# Patient Record
Sex: Male | Born: 1945 | Race: White | Hispanic: No | Marital: Married
Health system: Southern US, Community
[De-identification: ages and names within clinical notes are randomized; demographics above are authoritative.]

---

## 2020-02-10 ENCOUNTER — Ambulatory Visit: Payer: Medicare Other | Attending: Internal Medicine

## 2020-02-10 DIAGNOSIS — Z23 Encounter for immunization: Secondary | ICD-10-CM | POA: Insufficient documentation

## 2020-02-10 NOTE — Progress Notes (Signed)
   Covid-[facility] Vaccination [facility]  Name:  Craig Leach    MRN: [id number] DOB: [birth year]  02/10/2020  [name] was observed post Covid-19 immunization for 15 minutes without incidence. He was provided with Vaccine Information Sheet and instruction to access the V-Safe system.   [name] was instructed to call 911 with any severe reactions post vaccine: Marland Kitchen Difficulty breathing  . Swelling of your face and throat  . A fast heartbeat  . A bad rash all over your body  . Dizziness and weakness    Immunizations Administered    Name Date Dose VIS Date Route   Pfizer COVID-19 Vaccine 02/10/2020 12:57 PM 0.3 mL 11/30/2019 Intramuscular   Manufacturer: [place]   Lot: [id number]   [place]: [id number]

## 2020-02-14 ENCOUNTER — Other Ambulatory Visit: Payer: Self-pay | Admitting: Urology

## 2020-02-14 DIAGNOSIS — R972 Elevated prostate specific antigen [PSA]: Secondary | ICD-10-CM

## 2020-03-05 ENCOUNTER — Ambulatory Visit: Payer: Medicare Other | Attending: Internal Medicine

## 2020-03-05 DIAGNOSIS — Z23 Encounter for immunization: Secondary | ICD-10-CM

## 2020-03-05 NOTE — Progress Notes (Signed)
   Covid-19 Vaccination Clinic  Name:  Craig Leach    MRN: [id number] DOB: 04-02-1946  03/05/2020  [name] was observed post Covid-19 immunization for 15 minutes without incident. He was provided with Vaccine Information Sheet and instruction to access the V-Safe system.   [name] was instructed to call 911 with any severe reactions post vaccine: Marland Kitchen Difficulty breathing  . Swelling of face and throat  . A fast heartbeat  . A bad rash all over body  . Dizziness and weakness   Immunizations Administered    Name Date Dose VIS Date Route   Pfizer COVID-19 Vaccine 03/05/2020 12:02 PM 0.3 mL 11/30/2019 Intramuscular   Manufacturer: [place]   Lot: [id number]   [place]: [id number]

## 2020-03-11 ENCOUNTER — Ambulatory Visit
Admission: RE | Admit: 2020-03-11 | Discharge: 2020-03-11 | Disposition: A | Discharge status: Home / Self Care | Payer: Medicare Other | Source: Ambulatory Visit | Attending: Urology | Admitting: Urology

## 2020-03-11 ENCOUNTER — Other Ambulatory Visit: Payer: Self-pay

## 2020-03-11 DIAGNOSIS — R972 Elevated prostate specific antigen [PSA]: Secondary | ICD-10-CM

## 2020-03-11 IMAGING — MR MR PROSTATE WO/W CM
56 series · 56 of 56 positions shown · IV contrast (19ml Multihance)
Comparison: None.

CLINICAL DATA: Elevated PSA equal 7.35.  Benign biopsy [DATE].

EXAM:
MR PROSTATE WITHOUT AND WITH CONTRAST
TECHNIQUE: Multiplanar multisequence MRI images were obtained of the pelvis
centered about the prostate. Pre and post contrast images were
obtained.
CONTRAST:  19mL MULTIHANCE GADOBENATE DIMEGLUMINE 529 MG/ML IV SOLN

[Series 3: bSSFP fat-sat · axial · 8.0mm · 0.78mm/px · 1 of 28 slices shown]
[im 1/28]
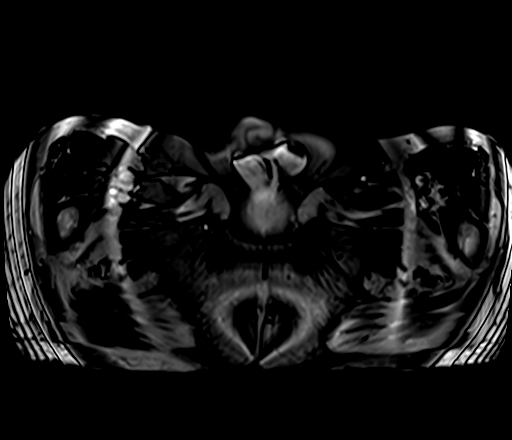

[Series 4: T1 · axial · 5.0mm · 1.25mm/px · 1 of 80 slices shown]
[im 1/80]
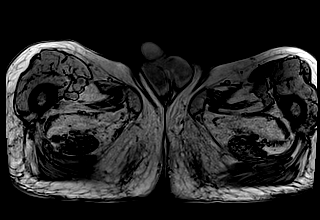

[Series 5: T2 · coronal · 3.5mm · 0.70mm/px · 1 of 29 slices shown (1 of 3)]
[im 1/29]
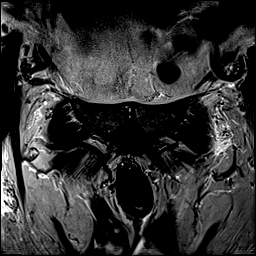

[Series 6: DWI · axial · 3.5mm · 1.75mm/px · 1 of 81 slices shown (1 of 3)]
[im 1/81]
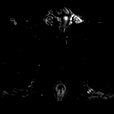

[Series 7: DWI · axial · 3.5mm · 1.75mm/px · 1 of 27 slices shown (2 of 3)]
[im 1/27]
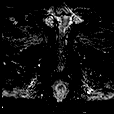

[Series 8: DWI · axial · 3.5mm · 1.56mm/px · 1 of 25 slices shown (3 of 3)]
[im 1/25]
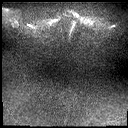

[Series 9: T2 · axial · 3.5mm · 0.70mm/px · 1 of 23 slices shown (2 of 3)]
[im 1/23]
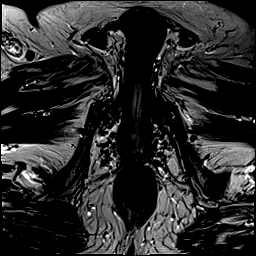

[Series 10: T2 · axial · 1.0mm · 1.04mm/px · 1 of 80 slices shown (3 of 3)]
[im 1/80]
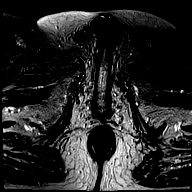

[Series 11: pre t1_twist_tra_dyn_ttc=5.8s · axial · non-contrast · 3.5mm · 0.83mm/px · 1 of 22 slices shown]
[im 1/22]
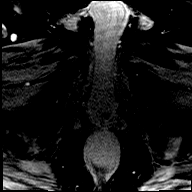

[Series 12: post t1_twist_tra_dyn-copy center · axial · 3.5mm · 0.83mm/px · 1 of 22 slices shown (1 of 24)]
[im 1/22]
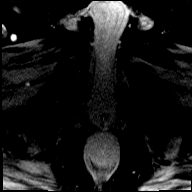

[Series 13: post t1_twist_tra_dyn-copy center · axial · 3.5mm · 0.83mm/px · 1 of 22 slices shown (2 of 24)]
[im 1/22]
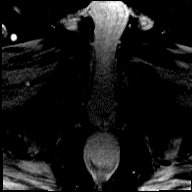

[Series 14: post t1_twist_tra_dyn-copy cent_sub_ttc=(id) · axial · 3.5mm · 0.83mm/px · 1 of 4 slices shown (1 of 23)]
[im 1/4]
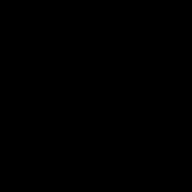

[Series 15: post t1_twist_tra_dyn-copy center · axial · 3.5mm · 0.83mm/px · 1 of 22 slices shown (3 of 24)]
[im 1/22]
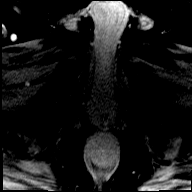

[Series 16: post t1_twist_tra_dyn-copy cent_sub_ttc=(id) · axial · 3.5mm · 0.83mm/px · 1 of 12 slices shown (2 of 23)]
[im 1/12]
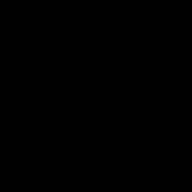

[Series 17: post t1_twist_tra_dyn-copy center · axial · 3.5mm · 0.83mm/px · 1 of 22 slices shown (4 of 24)]
[im 1/22]
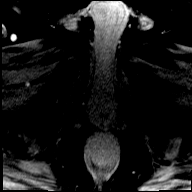

[Series 18: post t1_twist_tra_dyn-copy cent_sub_ttc=(id) · axial · 3.5mm · 0.83mm/px · 1 of 13 slices shown (3 of 23)]
[im 1/13]
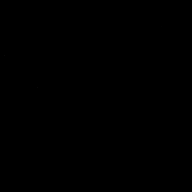

[Series 19: post t1_twist_tra_dyn-copy center · axial · 3.5mm · 0.83mm/px · 1 of 22 slices shown (5 of 24)]
[im 1/22]
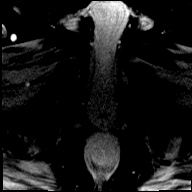

[Series 20: post t1_twist_tra_dyn-copy cent_sub_ttc=(id) · axial · 3.5mm · 0.83mm/px · 1 of 11 slices shown (4 of 23)]
[im 1/11]
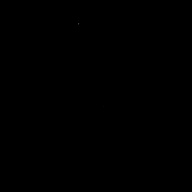

[Series 21: post t1_twist_tra_dyn-copy center · axial · 3.5mm · 0.83mm/px · 1 of 22 slices shown (6 of 24)]
[im 1/22]
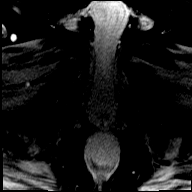

[Series 22: post t1_twist_tra_dyn-copy cent_sub_ttc=(id) · axial · 3.5mm · 0.83mm/px · 1 of 10 slices shown (5 of 23)]
[im 1/10]
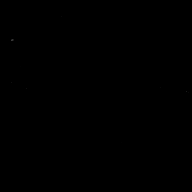

[Series 23: post t1_twist_tra_dyn-copy center · axial · 3.5mm · 0.83mm/px · 1 of 22 slices shown (7 of 24)]
[im 1/22]
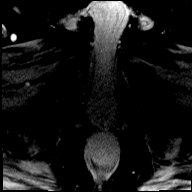

[Series 24: post t1_twist_tra_dyn-copy cent_sub_ttc=(id) · axial · 3.5mm · 0.83mm/px · 1 of 14 slices shown (6 of 23)]
[im 1/14]
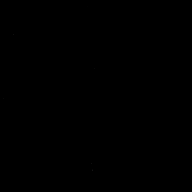

[Series 25: post t1_twist_tra_dyn-copy center · axial · 3.5mm · 0.83mm/px · 1 of 22 slices shown (8 of 24)]
[im 1/22]
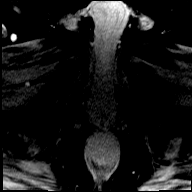

[Series 26: post t1_twist_tra_dyn-copy cent_sub_ttc=(id) · axial · 3.5mm · 0.83mm/px · 1 of 18 slices shown (7 of 23)]
[im 1/18]
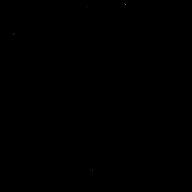

[Series 27: post t1_twist_tra_dyn-copy center · axial · 3.5mm · 0.83mm/px · 1 of 22 slices shown (9 of 24)]
[im 1/22]
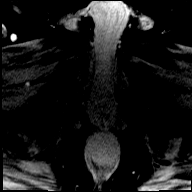

[Series 28: post t1_twist_tra_dyn-copy cent_sub_ttc=(id) · axial · 3.5mm · 0.83mm/px · 1 of 17 slices shown (8 of 23)]
[im 1/17]
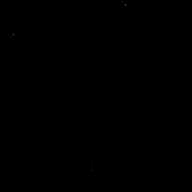

[Series 29: post t1_twist_tra_dyn-copy center · axial · 3.5mm · 0.83mm/px · 1 of 22 slices shown (10 of 24)]
[im 1/22]
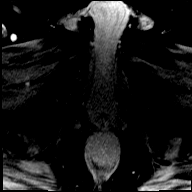

[Series 30: post t1_twist_tra_dyn-copy cent_sub_ttc=(id) · axial · 3.5mm · 0.83mm/px · 1 of 22 slices shown (9 of 23)]
[im 1/22]
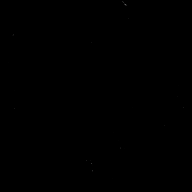

[Series 31: post t1_twist_tra_dyn-copy center · axial · 3.5mm · 0.83mm/px · 1 of 22 slices shown (11 of 24)]
[im 1/22]
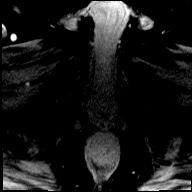

[Series 32: post t1_twist_tra_dyn-copy cent_sub_ttc=(id) · axial · 3.5mm · 0.83mm/px · 1 of 22 slices shown (10 of 23)]
[im 1/22]
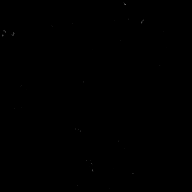

[Series 33: post t1_twist_tra_dyn-copy center · axial · 3.5mm · 0.83mm/px · 1 of 22 slices shown (12 of 24)]
[im 1/22]
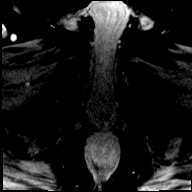

[Series 34: post t1_twist_tra_dyn-copy cent_sub_ttc=(id) · axial · 3.5mm · 0.83mm/px · 1 of 22 slices shown (11 of 23)]
[im 1/22]
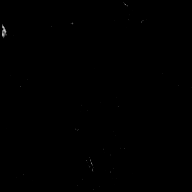

[Series 35: post t1_twist_tra_dyn-copy center · axial · 3.5mm · 0.83mm/px · 1 of 22 slices shown (13 of 24)]
[im 1/22]
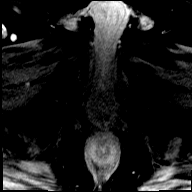

[Series 36: post t1_twist_tra_dyn-copy cent_sub_ttc=(id) · axial · 3.5mm · 0.83mm/px · 1 of 22 slices shown (12 of 23)]
[im 1/22]
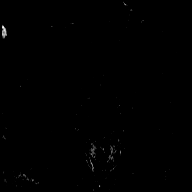

[Series 37: post t1_twist_tra_dyn-copy center · axial · 3.5mm · 0.83mm/px · 1 of 22 slices shown (14 of 24)]
[im 1/22]
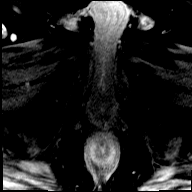

[Series 38: post t1_twist_tra_dyn-copy cent_sub_ttc=(id) · axial · 3.5mm · 0.83mm/px · 1 of 22 slices shown (13 of 23)]
[im 1/22]
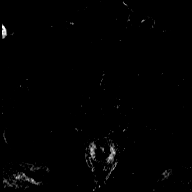

[Series 39: post t1_twist_tra_dyn-copy center · axial · 3.5mm · 0.83mm/px · 1 of 22 slices shown (15 of 24)]
[im 1/22]
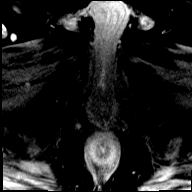

[Series 40: post t1_twist_tra_dyn-copy cent_sub_ttc=(id) · axial · 3.5mm · 0.83mm/px · 1 of 22 slices shown (14 of 23)]
[im 1/22]
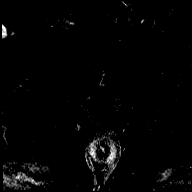

[Series 41: post t1_twist_tra_dyn-copy center · axial · 3.5mm · 0.83mm/px · 1 of 22 slices shown (16 of 24)]
[im 1/22]
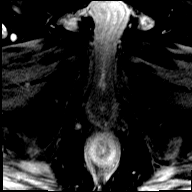

[Series 42: post t1_twist_tra_dyn-copy cent_sub_ttc=(id) · axial · 3.5mm · 0.83mm/px · 1 of 22 slices shown (15 of 23)]
[im 1/22]
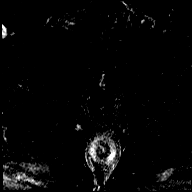

[Series 43: post t1_twist_tra_dyn-copy center · axial · 3.5mm · 0.83mm/px · 1 of 22 slices shown (17 of 24)]
[im 1/22]
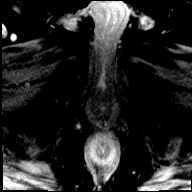

[Series 44: post t1_twist_tra_dyn-copy cent_sub_ttc=(id) · axial · 3.5mm · 0.83mm/px · 1 of 22 slices shown (16 of 23)]
[im 1/22]
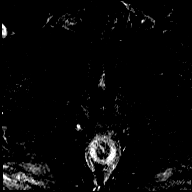

[Series 45: post t1_twist_tra_dyn-copy center · axial · 3.5mm · 0.83mm/px · 1 of 22 slices shown (18 of 24)]
[im 1/22]
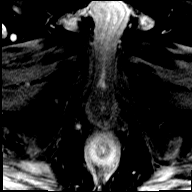

[Series 46: post t1_twist_tra_dyn-copy cent_sub_ttc=(id) · axial · 3.5mm · 0.83mm/px · 1 of 22 slices shown (17 of 23)]
[im 1/22]
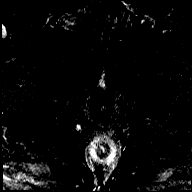

[Series 47: post t1_twist_tra_dyn-copy center · axial · 3.5mm · 0.83mm/px · 1 of 22 slices shown (19 of 24)]
[im 1/22]
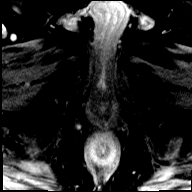

[Series 48: post t1_twist_tra_dyn-copy cent_sub_ttc=(id) · axial · 3.5mm · 0.83mm/px · 1 of 22 slices shown (18 of 23)]
[im 1/22]
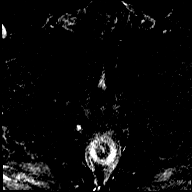

[Series 49: post t1_twist_tra_dyn-copy center · axial · 3.5mm · 0.83mm/px · 1 of 22 slices shown (20 of 24)]
[im 1/22]
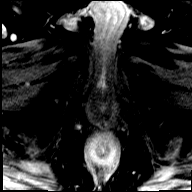

[Series 50: post t1_twist_tra_dyn-copy cent_sub_ttc=(id) · axial · 3.5mm · 0.83mm/px · 1 of 22 slices shown (19 of 23)]
[im 1/22]
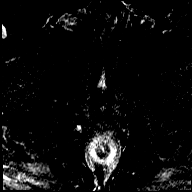

[Series 51: post t1_twist_tra_dyn-copy center · axial · 3.5mm · 0.83mm/px · 1 of 22 slices shown (21 of 24)]
[im 1/22]
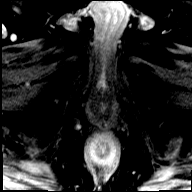

[Series 52: post t1_twist_tra_dyn-copy cent_sub_ttc=(id) · axial · 3.5mm · 0.83mm/px · 1 of 22 slices shown (20 of 23)]
[im 1/22]
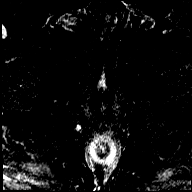

[Series 53: post t1_twist_tra_dyn-copy center · axial · 3.5mm · 0.83mm/px · 1 of 22 slices shown (22 of 24)]
[im 1/22]
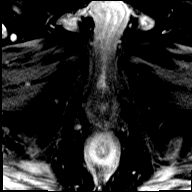

[Series 54: post t1_twist_tra_dyn-copy cent_sub_ttc=(id) · axial · 3.5mm · 0.83mm/px · 1 of 22 slices shown (21 of 23)]
[im 1/22]
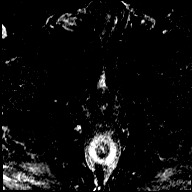

[Series 55: post t1_twist_tra_dyn-copy center · axial · 3.5mm · 0.83mm/px · 1 of 22 slices shown (23 of 24)]
[im 1/22]
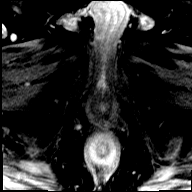

[Series 56: post t1_twist_tra_dyn-copy cent_sub_ttc=(id) · axial · 3.5mm · 0.83mm/px · 1 of 22 slices shown (22 of 23)]
[im 1/22]
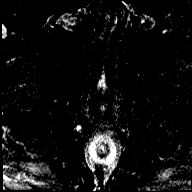

[Series 57: post t1_twist_tra_dyn-copy center · axial · 3.5mm · 0.83mm/px · 1 of 22 slices shown (24 of 24)]
[im 1/22]
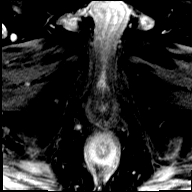

[Series 58: post t1_twist_tra_dyn-copy cent_sub_ttc=(id) · axial · 3.5mm · 0.83mm/px · 1 of 22 slices shown (23 of 23)]
[im 1/22]
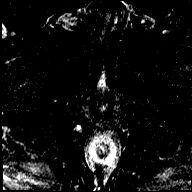

[56 of 56 positions shown; findings below may reference images not displayed]

FINDINGS: Prostate: Normal high signal intensity in peripheral zone on T2
weighted imaging without focal lesion. No foci of restricted
diffusion in peripheral zone. No abnormal enhancement in the
peripheral zone

The transitional zone is enlarged capsulated nodules. No restricted
diffusion.

Volume: 3.9 x 5.3 x 4.0 cm (volume = 43 cm^3)

Transcapsular spread:  Absent

Seminal vesicle involvement: Absent

Neurovascular bundle involvement: Absent

Pelvic adenopathy: None

Bone metastasis: Absent

Other findings: None
IMPRESSION: 1. No high-grade carcinoma in peripheral zone ( PI-RADS: 1).
2. Nodular transitional zone consistent with benign prostate
hypertrophy ( PI-RADS: 1).

## 2020-03-11 MED ORDER — GADOBENATE DIMEGLUMINE 529 MG/ML IV SOLN
19.0000 mL | Freq: Once | INTRAVENOUS | Status: AC | PRN
  Administered 2020-03-11: 19 mL via INTRAVENOUS

## 2020-03-13 ENCOUNTER — Other Ambulatory Visit: Payer: Self-pay | Admitting: Urology

## 2020-03-13 DIAGNOSIS — D49512 Neoplasm of unspecified behavior of left kidney: Secondary | ICD-10-CM

## 2020-09-04 ENCOUNTER — Ambulatory Visit
Admission: RE | Admit: 2020-09-04 | Discharge: 2020-09-04 | Disposition: A | Discharge status: Home / Self Care | Payer: Medicare Other | Source: Ambulatory Visit | Attending: Urology | Admitting: Urology

## 2020-09-04 ENCOUNTER — Other Ambulatory Visit: Payer: Self-pay

## 2020-09-04 DIAGNOSIS — D49512 Neoplasm of unspecified behavior of left kidney: Secondary | ICD-10-CM

## 2020-09-04 IMAGING — MR MR ABDOMEN WO/W CM
15 series · 47 of 48 positions shown · IV contrast (multihance)
Comparison: [DATE]

CLINICAL DATA: Follow-up left renal neoplasm.

EXAM:
MRI ABDOMEN WITHOUT AND WITH CONTRAST
TECHNIQUE: Multiplanar multisequence MR imaging of the abdomen was performed
both before and after the administration of intravenous contrast.
CONTRAST:  20mL MULTIHANCE GADOBENATE DIMEGLUMINE 529 MG/ML IV SOLN

[Series 4: T2 · coronal · 5.0mm · 0.74mm/px · 1 of 24 slices shown (1 of 3)]
[im 1/24]
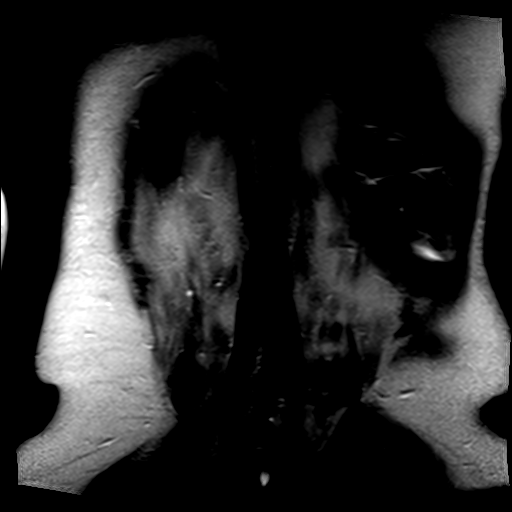

[Series 5: T2 · axial · 5.0mm · 0.68mm/px · 1 of 27 slices shown (2 of 3)]
[im 1/27]
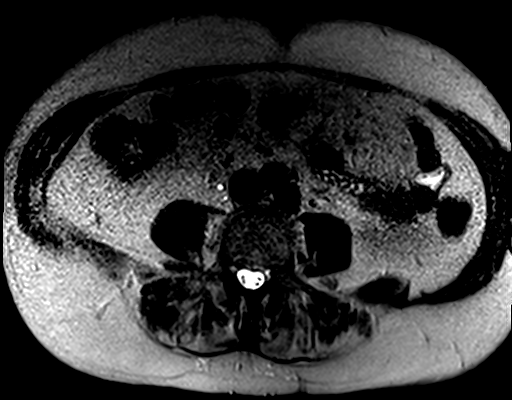

[Series 6: ep2d_diff_b50_500_800_p2_trig · axial · 5.0mm · 1.82mm/px · z∈[-163,-7]mm · 5 of 81 slices shown]
[im 1/81]
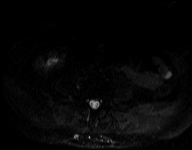
[im 21/81]
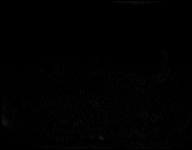
[im 41/81]
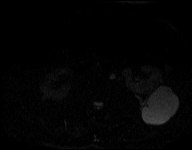
[im 61/81]
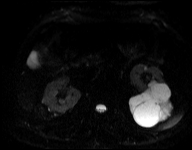
[im 81/81]
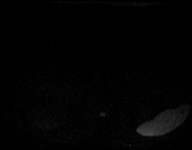

[Series 7: ep2d_diff_b50_500_800_p2_trig_adc · axial · 5.0mm · 1.82mm/px · z∈[-163,-7]mm · 2 of 27 slices shown]
[im 1/27]
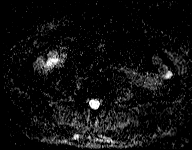
[im 27/27]
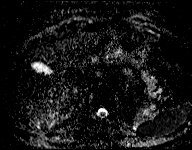

[Series 8: T2 · axial · 5.0mm · 1.37mm/px · z∈[-163,-7]mm · 2 of 27 slices shown (3 of 3)]
[im 1/27]
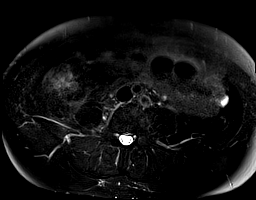
[im 27/27]
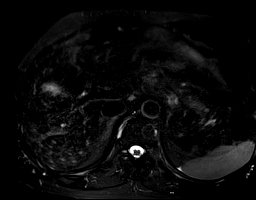

[Series 9: bSSFP · axial · 4.0mm · 0.68mm/px · z∈[-171,+1]mm · 3 of 44 slices shown]
[im 1/44]
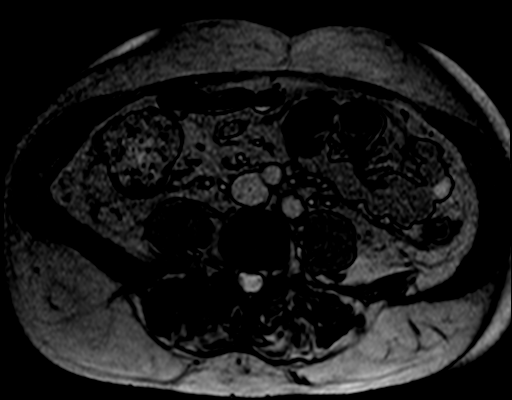
[im 22/44]
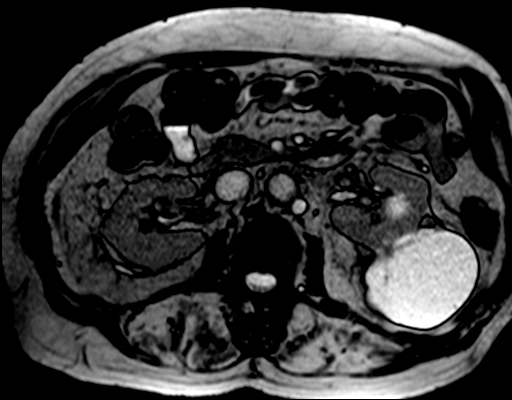
[im 44/44]
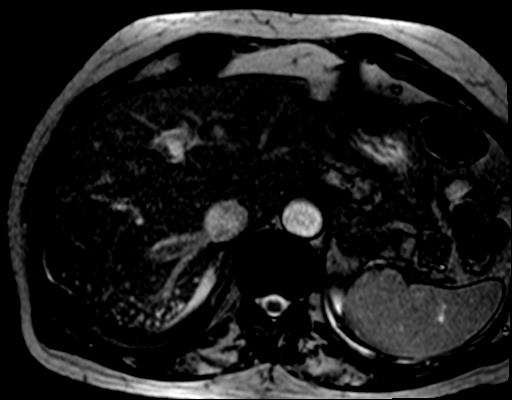

[Series 10: T1 · axial · 5.0mm · 0.68mm/px · z∈[-163,-7]mm · 3 of 54 slices shown]
[im 1/54]
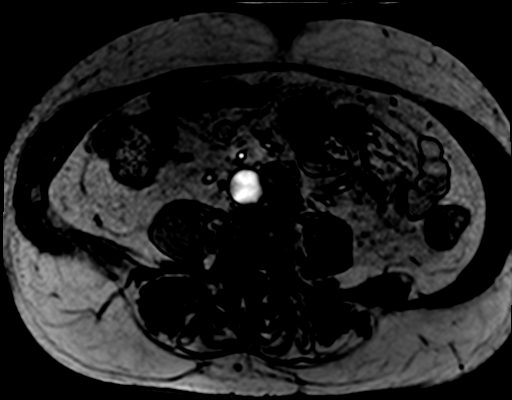
[im 27/54]
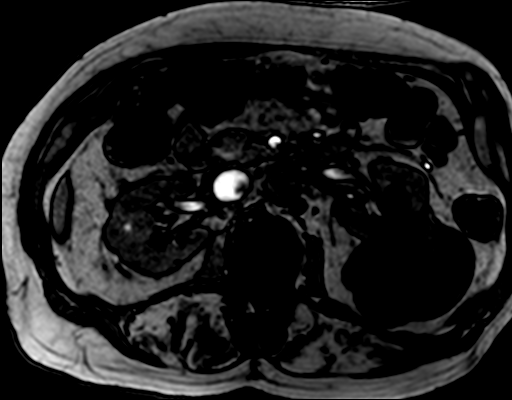
[im 54/54]
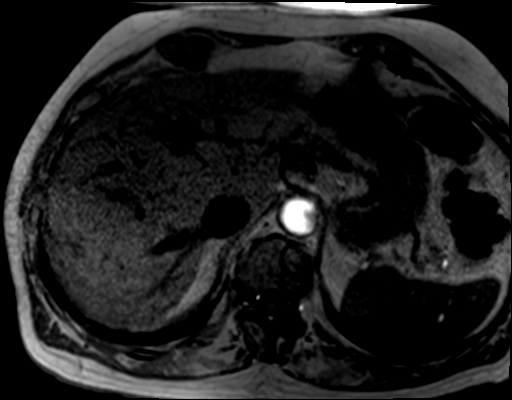

[Series 11: T1 dynamic · axial · non-contrast · 2.3mm · 1.37mm/px · z∈[-166,-4]mm · 4 of 72 slices shown]
[im 1/72]
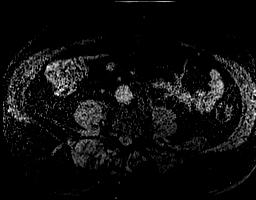
[im 24/72]
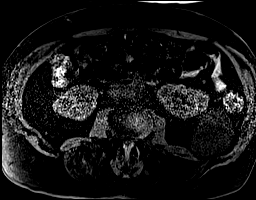
[im 48/72]
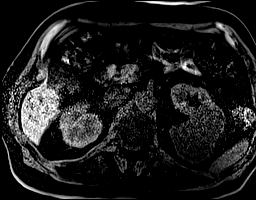
[im 72/72]
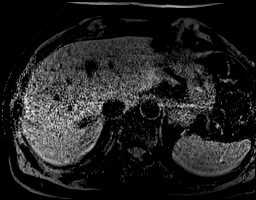

[Series 12: post 25 sec · axial · 2.3mm · 1.37mm/px · z∈[-166,-4]mm · 4 of 72 slices shown]
[im 1/72]
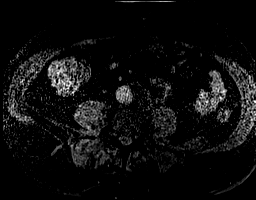
[im 24/72]
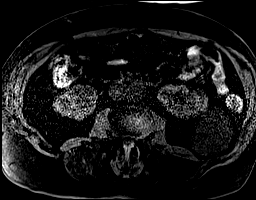
[im 48/72]
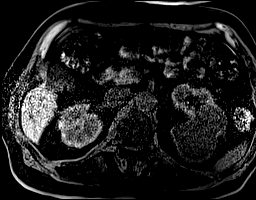
[im 72/72]
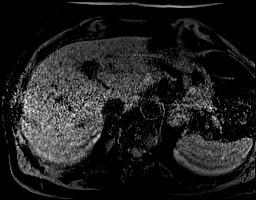

[Series 13: post 25 sec_sub · axial · 2.3mm · 1.37mm/px · z∈[-166,-4]mm · 4 of 72 slices shown]
[im 1/72]
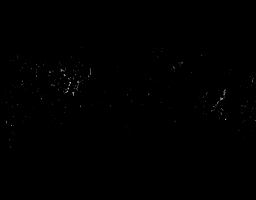
[im 24/72]
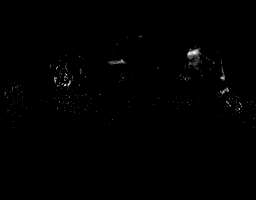
[im 48/72]
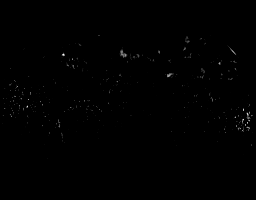
[im 72/72]
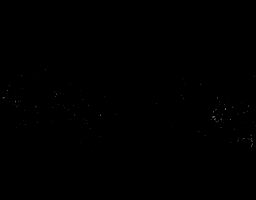

[Series 14: post 45 sec · axial · 2.3mm · 1.37mm/px · z∈[-166,-4]mm · 4 of 72 slices shown]
[im 1/72]
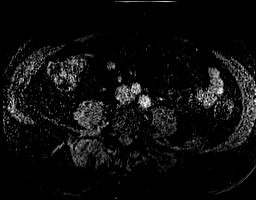
[im 24/72]
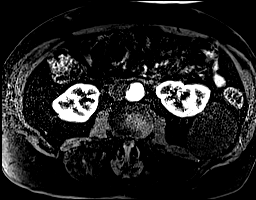
[im 48/72]
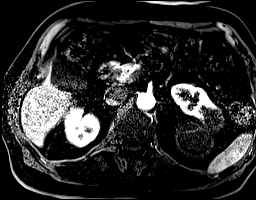
[im 72/72]
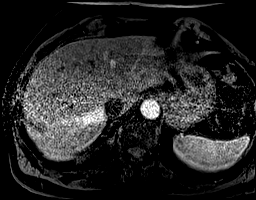

[Series 15: post 45 sec_sub · axial · 2.3mm · 1.37mm/px · z∈[-166,-4]mm · 4 of 72 slices shown]
[im 1/72]
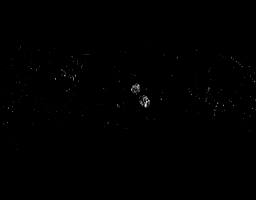
[im 24/72]
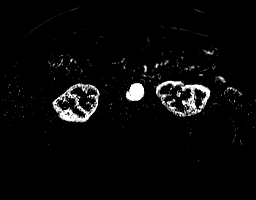
[im 48/72]
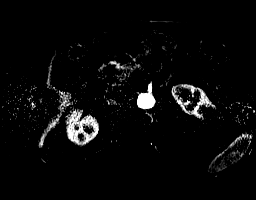
[im 72/72]
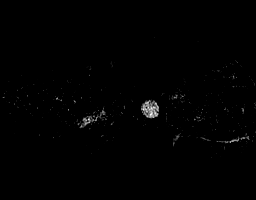

[Series 16: post 90 sec · axial · 2.3mm · 1.37mm/px · z∈[-166,-4]mm · 4 of 72 slices shown]
[im 1/72]
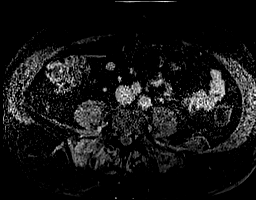
[im 24/72]
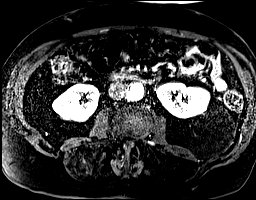
[im 48/72]
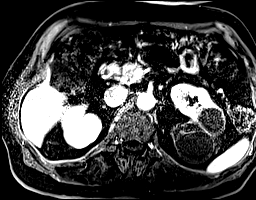
[im 72/72]
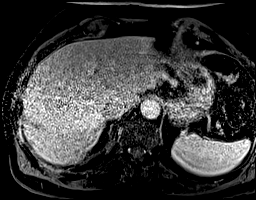

[Series 17: post 90 sec_sub · axial · 2.3mm · 1.37mm/px · z∈[-166,-4]mm · 4 of 72 slices shown]
[im 1/72]
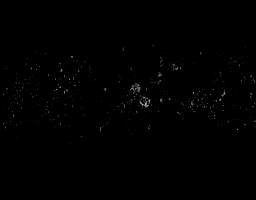
[im 24/72]
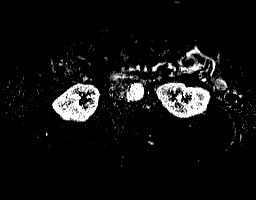
[im 48/72]
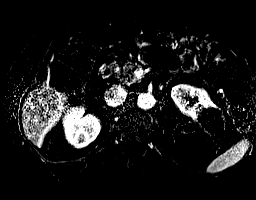
[im 72/72]
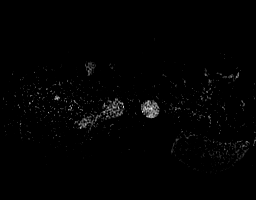

[Series 18: T1 dynamic post-contrast · coronal · 2.5mm · 0.74mm/px · 2 of 52 slices shown]
[im 1/52]
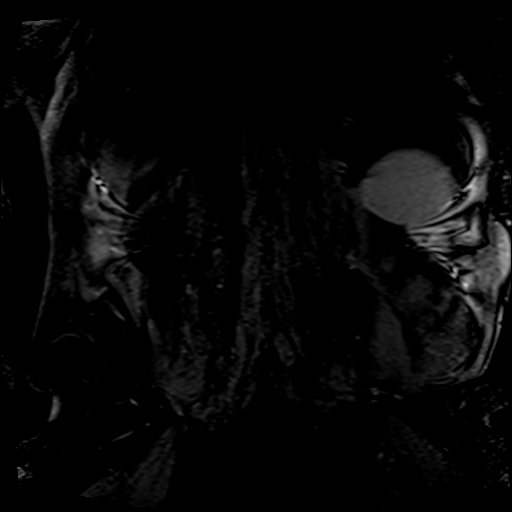
[im 26/52]
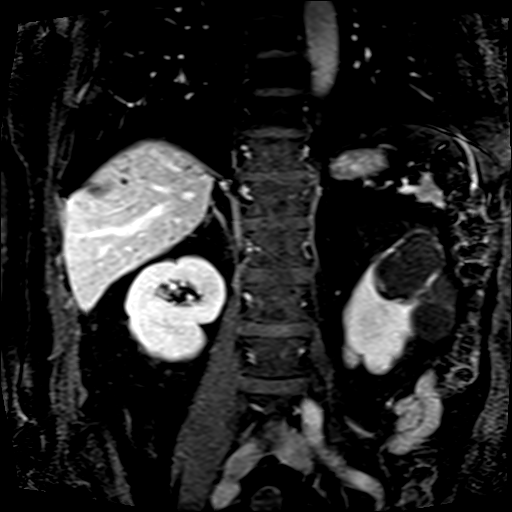

[47 of 48 positions shown; findings below may reference images not displayed]

FINDINGS: Lower chest: No acute abnormality.

Hepatobiliary: Numerous, subcentimeter T2 hyperintense foci are
scattered throughout both lobes of the liver without enhancement.
These are favored to represent either benign liver hamartomas or
cysts. Gallstones identified measuring up to 6 mm. No signs of
gallbladder wall inflammation or biliary ductal dilatation.

Pancreas: Within the head of pancreas there is a 8 mm cystic
structure, image [DATE]. Within the mid tail of pancreas there is a 7
mm cystic structure, image [DATE]. No suspicious enhancing pancreas
lesion. No inflammation or main duct dilatation.

Spleen:  Within normal limits in size and appearance.

Adrenals/Urinary Tract: Normal appearance of the adrenal glands. No
solid enhancing right kidney lesion. Small cyst arising from the
upper pole of right kidney measures 1.1 cm. Large, complicated
septated cyst arising from the posterolateral cortex of the left
upper and mid kidney. This measures 9.0 x 7.1 cm, image [DATE].
Unchanged in size from previous exam. Multiple thin internal areas
of septation are again identified. No suspicious enhancing mural
nodule or thickened areas of septation identified. Separate cysts
containing and thin internal area of septation measuring 2 cm is
stable, image [DATE]. No hydronephrosis identified bilaterally.

Stomach/Bowel: Visualized portions within the abdomen are
unremarkable.

Vascular/Lymphatic: No pathologically enlarged lymph nodes
identified. Aortic atherosclerosis Stable 3 cm infrarenal abdominal
aortic aneurysm, image 45/14. Recommend follow-up every 3 years.
This recommendation follows ACR consensus guidelines: White Paper of
the ACR Incidental Findings Committee II on Vascular Findings. [HOSPITAL] [Y3]; [DATE].

Other:  None.

Musculoskeletal: No suspicious bone lesions identified.
IMPRESSION: 1. Stable appearance of large, complicated septated cyst arising
from the posterolateral cortex of the left upper and mid kidney.
Multiple thin internal areas of septation are again identified. No
suspicious enhancing mural nodule or thickened areas of septation
identified. This is most consistent with a Bosniak category 2F
lesion. Follow-up imaging in 12 months with repeat MRI of the
abdomen without and with contrast material is advised.
2. There are 2 small cystic lesions within the pancreas. Follow-up
imaging with repeat MRI of the abdomen without and with contrast
material in 24 months is recommended. This recommendation follows
ACR consensus guidelines: Management of Incidental Pancreatic Cysts:
A White Paper of the ACR Incidental Findings Committee. [HOSPITAL] [Y3];[DATE].
3. Cholelithiasis.
4. Stable 3 cm infrarenal abdominal aortic aneurysm. Recommend
follow-up every 3 years. this recommendation follows ACR consensus
guidelines: White Paper of the ACR Incidental Findings Committee II

## 2020-09-04 MED ORDER — GADOBENATE DIMEGLUMINE 529 MG/ML IV SOLN
20.0000 mL | Freq: Once | INTRAVENOUS | Status: AC | PRN
  Administered 2020-09-04: 20 mL via INTRAVENOUS

## 2021-08-04 ENCOUNTER — Other Ambulatory Visit: Payer: Self-pay | Admitting: Urology

## 2021-08-04 DIAGNOSIS — D49512 Neoplasm of unspecified behavior of left kidney: Secondary | ICD-10-CM

## 2021-09-09 ENCOUNTER — Ambulatory Visit
Admission: RE | Admit: 2021-09-09 | Discharge: 2021-09-09 | Disposition: A | Discharge status: Home / Self Care | Payer: Medicare Other | Source: Ambulatory Visit | Attending: Urology | Admitting: Urology

## 2021-09-09 ENCOUNTER — Other Ambulatory Visit: Payer: Self-pay

## 2021-09-09 DIAGNOSIS — D49512 Neoplasm of unspecified behavior of left kidney: Secondary | ICD-10-CM

## 2021-09-09 IMAGING — MR MR ABDOMEN WO/W CM
11 of 17 series · 29 of 48 positions shown · IV contrast (20 ml multihance)
Comparison: [DATE]

CLINICAL DATA: Follow-up complex cyst of the left kidney,
previously characterized as Bosniak 2F

EXAM:
MRI ABDOMEN WITHOUT AND WITH CONTRAST
TECHNIQUE: Multiplanar multisequence MR imaging of the abdomen was performed
both before and after the administration of intravenous contrast.
CONTRAST:  20mL MULTIHANCE GADOBENATE DIMEGLUMINE 529 MG/ML IV SOLN

[Series 3: T2 · coronal · 5.0mm · 1.56mm/px · 2 of 20 slices shown (1 of 3)]
[im 1/20]
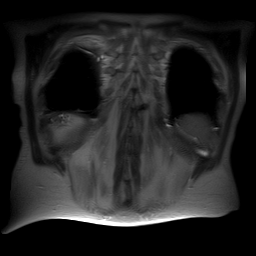
[im 20/20]
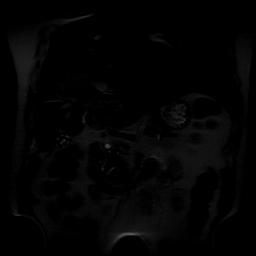

[Series 4: axial tru fisp · axial · 4.0mm · 1.48mm/px · z∈[-175,-24]mm · 3 of 30 slices shown]
[im 1/30]
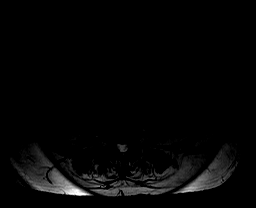
[im 15/30]
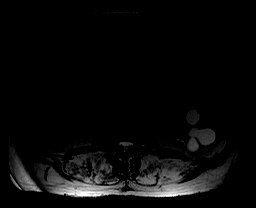
[im 30/30]
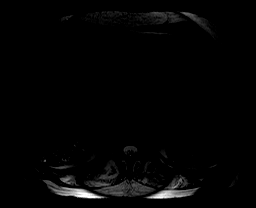

[Series 5: ep2d_diff_b50_500_800_p2 · axial · 6.0mm · 1.98mm/px · z∈[-160,+27]mm · 5 of 75 slices shown]
[im 1/75]
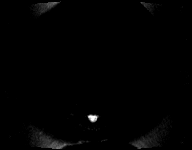
[im 19/75]
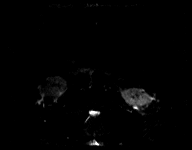
[im 38/75]
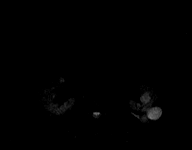
[im 56/75]
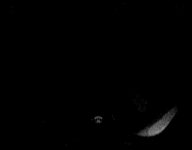
[im 75/75]
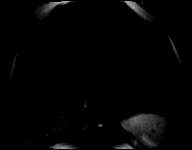

[Series 6: ep2d_diff_b50_500_800_p2_adc · axial · 6.0mm · 1.98mm/px · z∈[-160,+27]mm · 2 of 25 slices shown]
[im 1/25]
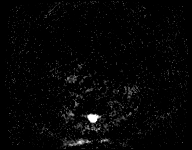
[im 25/25]
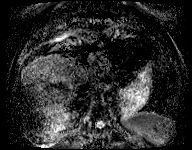

[Series 7: T2 · axial · 6.5mm · 0.74mm/px · 1 of 24 slices shown (2 of 3)]
[im 1/24]
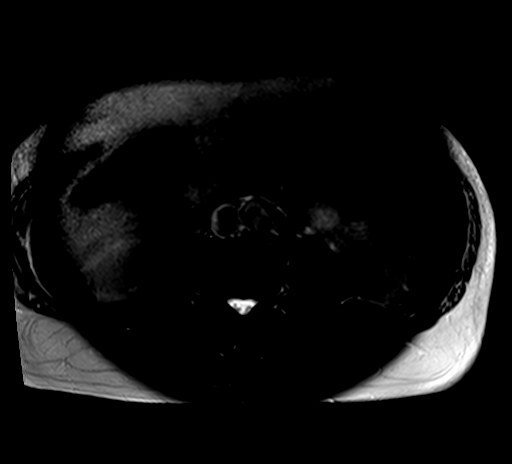

[Series 8: T2 · axial · 5.0mm · 1.37mm/px · z∈[-186,+2]mm · 2 of 30 slices shown (3 of 3)]
[im 1/30]
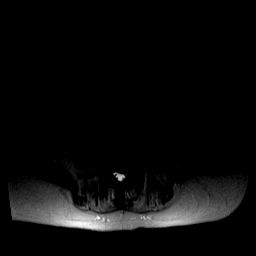
[im 30/30]
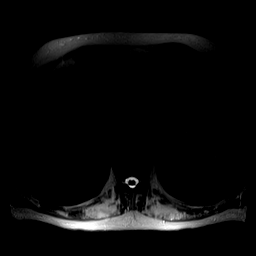

[Series 9: axial in out · axial · 5.5mm · 0.74mm/px · z∈[-177,-6]mm · 3 of 56 slices shown]
[im 1/56]
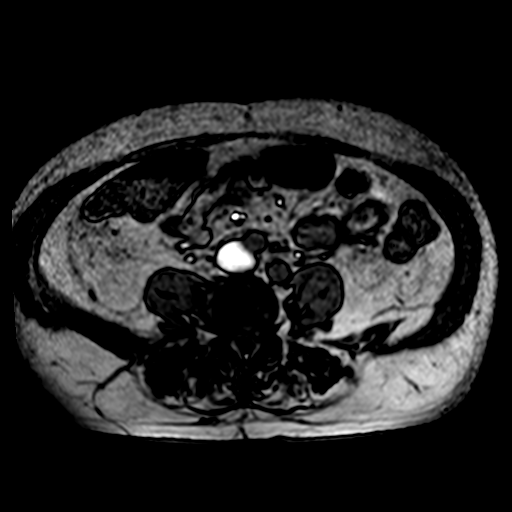
[im 28/56]
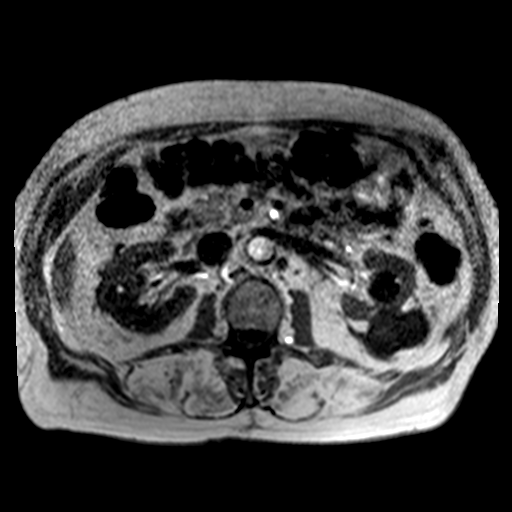
[im 56/56]
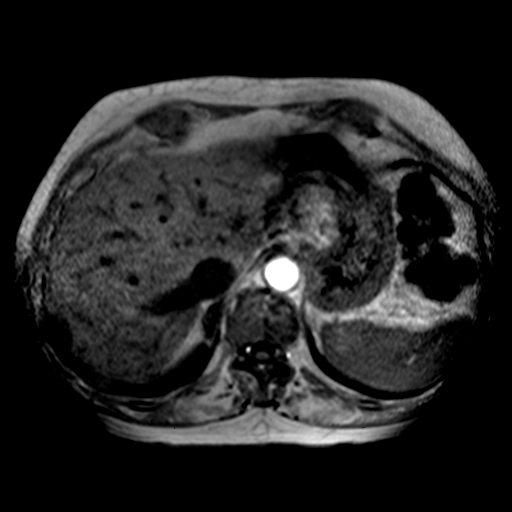

[Series 10: T1 dynamic · axial · non-contrast · 2.5mm · 0.78mm/px · z∈[-182,-25]mm · 3 of 64 slices shown]
[im 1/64]
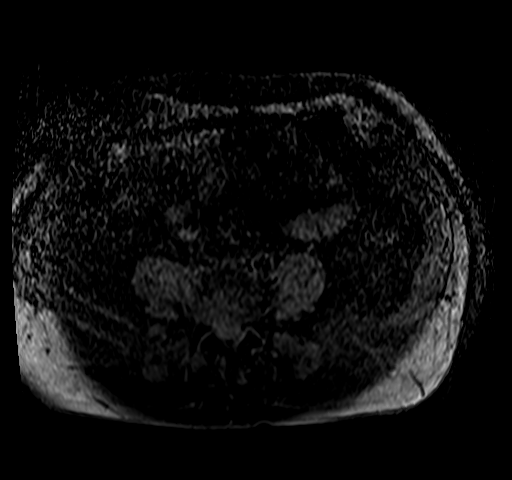
[im 32/64]
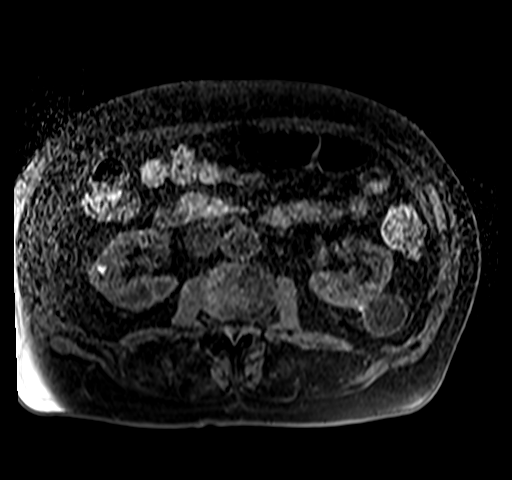
[im 64/64]
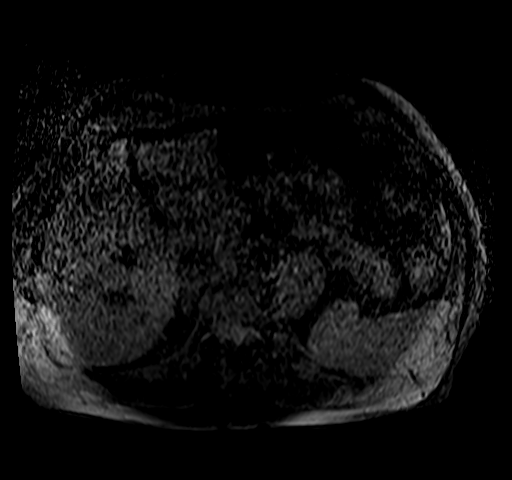

[Series 11: post 25 sec · axial · 2.5mm · 0.78mm/px · z∈[-182,-25]mm · 3 of 64 slices shown]
[im 1/64]
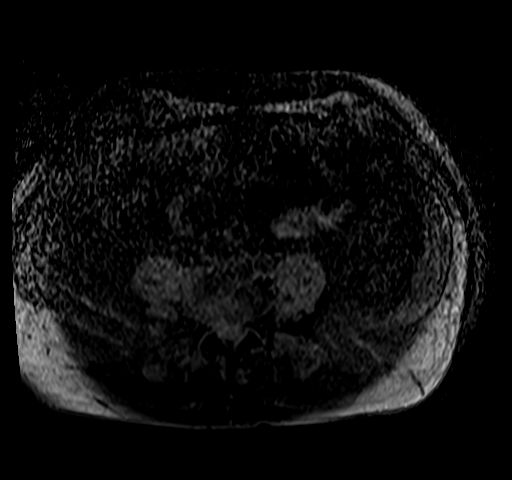
[im 32/64]
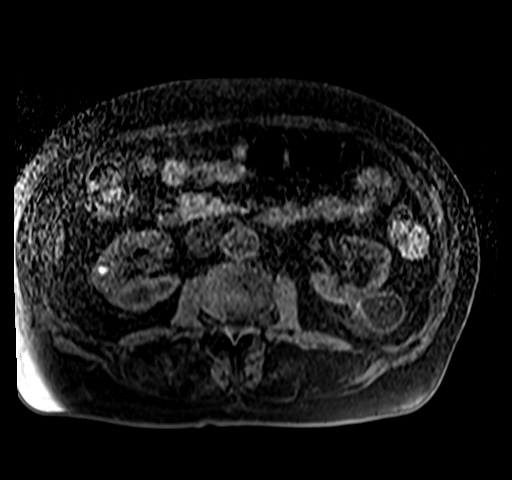
[im 64/64]
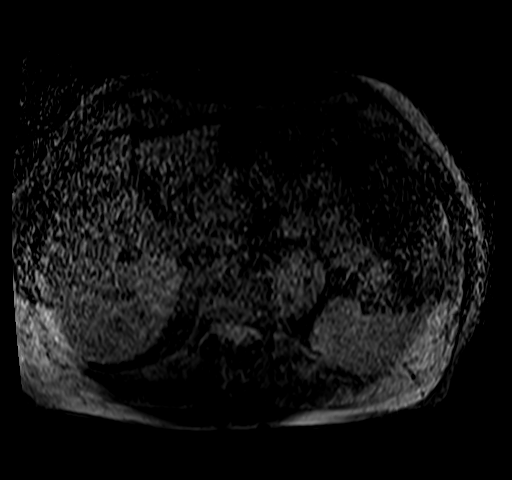

[Series 12: post 25 sec_sub · axial · 2.5mm · 0.78mm/px · z∈[-182,-25]mm · 3 of 64 slices shown]
[im 1/64]
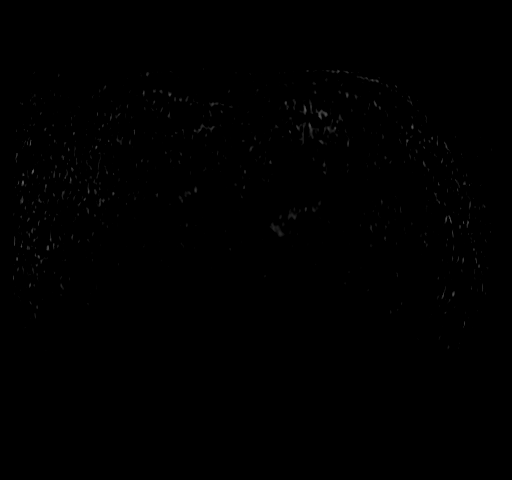
[im 32/64]
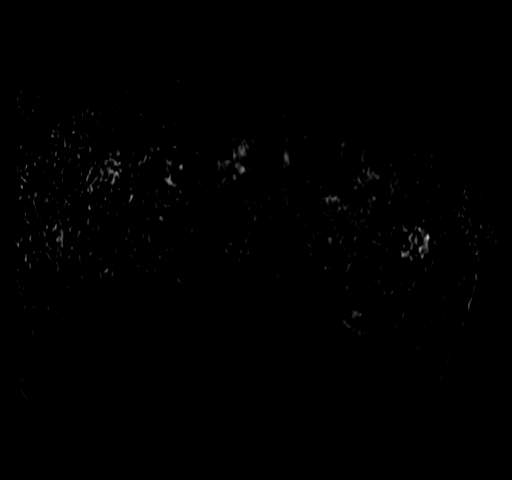
[im 64/64]
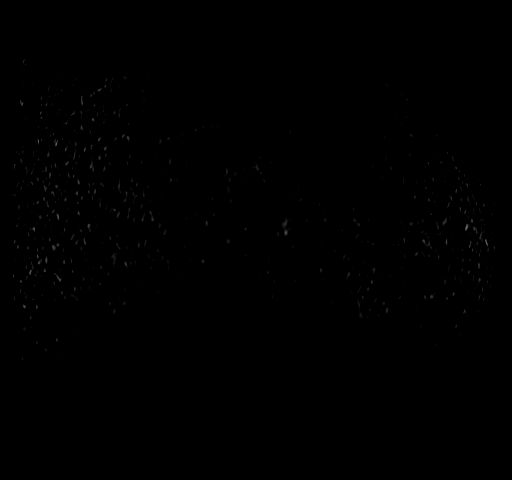

[Series 13: post 45 sec · axial · 2.5mm · 0.78mm/px · z∈[-182,-105]mm · 2 of 64 slices shown]
[im 1/64]
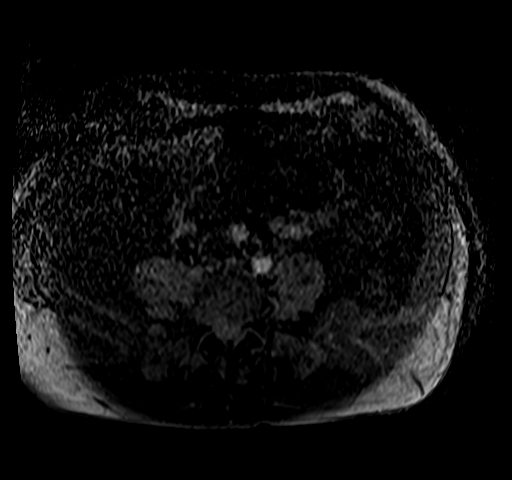
[im 32/64]
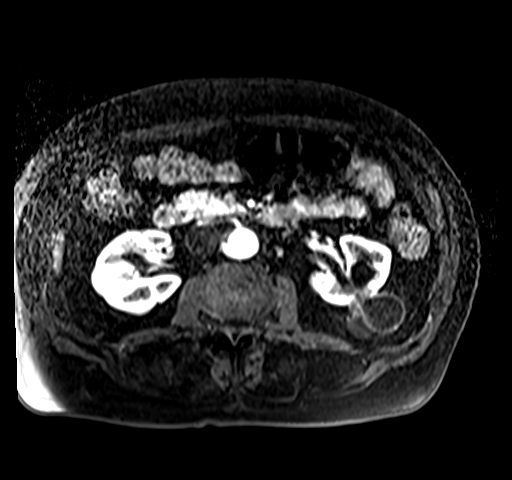

[29 of 48 positions shown; findings below may reference images not displayed]

FINDINGS: Lower chest: No acute findings.

Hepatobiliary: Unchanged appearance of the liver parenchyma with
innumerable tiny T2 hyperintense lesions scattered throughout. No
solid mass. Numerous small gallstones in the gallbladder. No biliary
ductal dilatation.

Pancreas: Unchanged subcentimeter fluid signal cystic lesions of the
pancreatic head (series 8, image 11) and tail (series 8, image 8,
7). No solid mass, inflammatory changes, or other parenchymal
abnormality identified. No pancreatic ductal dilatation.

Spleen:  Within normal limits in size and appearance.

Adrenals/Urinary Tract: No solid masses identified. A multiloculated
exophytic cystic lesion of the posterior midportion of the left
kidney is substantially reduced in size compared to prior
examination now measuring approximately 4.4 x 3.4 cm, previously
x 7.1 cm (series 8, image 17). There is some adjacent perinephric
fat stranding. No evidence of internal solid component or contrast
enhancement. Additional small parapelvic cysts of the left kidney.
No evidence of hydronephrosis.

Stomach/Bowel: Visualized portions within the abdomen are
unremarkable.

Vascular/Lymphatic: No pathologically enlarged lymph nodes
identified. No abdominal aortic aneurysm demonstrated.

Other:  None.

Musculoskeletal: No suspicious bone lesions identified.
IMPRESSION: 1. A multiloculated exophytic cystic lesion of the posterior
midportion of the left kidney is substantially reduced in size
compared to prior examination now measuring approximately 4.4 x
cm, previously 9.0 x 7.1 cm. There is some adjacent perinephric fat
stranding. No evidence of internal solid component or contrast
enhancement. Findings are consistent with rupture or involution of a
benign renal cortical cyst. No further follow-up or characterization
is required for this benign cyst.
2. Unchanged appearance of the liver parenchyma with innumerable
tiny T2 hyperintense lesions scattered throughout the liver
parenchyma, which as on prior examination may reflect polycystic
liver disease or biliary hamartomas.
3. Unchanged subcentimeter fluid signal cystic lesions of the
pancreas, consistent with small IPMNs or pseudocysts. As there is no
observed increased risk of malignancy with such lesions smaller than
2 cm, especially with initially established stability, no further
follow-up or characterization is required.
4. Cholelithiasis.

## 2021-09-09 MED ORDER — GADOBENATE DIMEGLUMINE 529 MG/ML IV SOLN
20.0000 mL | Freq: Once | INTRAVENOUS | Status: AC | PRN
  Administered 2021-09-09: 20 mL via INTRAVENOUS
